# Patient Record
Sex: Female | Born: 1950 | Race: White | Hispanic: No | Marital: Married | Smoking: Former smoker
Health system: Southern US, Community
[De-identification: ages and names within clinical notes are randomized; demographics above are authoritative.]

## PROBLEM LIST (undated history)

## (undated) HISTORY — PX: TUBAL LIGATION: SHX77

## (undated) HISTORY — PX: DILATION AND CURETTAGE OF UTERUS: SHX78

## (undated) HISTORY — PX: TOTAL HIP ARTHROPLASTY: SHX124

---

## 2004-06-25 ENCOUNTER — Ambulatory Visit: Payer: Self-pay | Admitting: Podiatry

## 2004-07-04 ENCOUNTER — Ambulatory Visit: Payer: Self-pay | Admitting: Podiatry

## 2016-05-04 ENCOUNTER — Encounter: Payer: Self-pay | Admitting: *Deleted

## 2016-05-04 ENCOUNTER — Ambulatory Visit
Admission: EM | Admit: 2016-05-04 | Discharge: 2016-05-04 | Disposition: A | Discharge status: Home / Self Care | Payer: BC Managed Care – PPO | Attending: Family Medicine | Admitting: Family Medicine

## 2016-05-04 ENCOUNTER — Ambulatory Visit (INDEPENDENT_AMBULATORY_CARE_PROVIDER_SITE_OTHER): Payer: BC Managed Care – PPO

## 2016-05-04 DIAGNOSIS — S20212A Contusion of left front wall of thorax, initial encounter: Secondary | ICD-10-CM

## 2016-05-04 MED ORDER — OXYCODONE HCL 5 MG PO TABS: 5.0000 mg | ORAL_TABLET | Freq: Three times a day (TID) | ORAL | 0 refills | Status: DC | PRN

## 2016-05-04 MED ORDER — OXYCODONE-ACETAMINOPHEN 5-325 MG PO TABS: 1.0000 | ORAL_TABLET | Freq: Three times a day (TID) | ORAL | 0 refills | Status: DC | PRN

## 2016-05-04 NOTE — ED Provider Notes (Signed)
[facility] ____________________________________________  Time seen: Approximately 435 PM  I have reviewed the triage vital signs and the nursing notes.   HISTORY  Chief Complaint Chest Pain   HPI Robyn Robertson is a 65 y.o. female presenting today for the complaints of left rib and breast tenderness. Patient reports pain onset 1 week ago after a fall. Patient reports that she got up in the middle of the night to go to the bathroom, tripped and stumbled causing her to hit her left rib on her footboard of her bed. Denies fall to the ground. Denies head injury or loss of consciousness. Patient reports pain is fully reproducible by direct palpation. Patient states no pain at rest. Denies any radiation pain. Patient denies any other pain or injury. Patient reports she fell only because she tripped when getting up quickly to bathroom overnight. States left breast was originally sore from hitting the bed, but states now resolved.  Patient denies any chest pain, shortness breath, arm pain, paresthesias, weakness, neck discomfort, chest pain with deep breath, dizziness, weakness, abdominal pain, dysuria, neck or back pain. Denies fevers. Denies recent sickness, hospitalizations, surgeries or antibiotic use.  PCP: Luz BrazenHartman   History reviewed. No pertinent past medical history. Anxiety  Left lower pneumonia 2016  There are no active problems to display for this patient. Anxiety  Past Surgical History:  Procedure Laterality Date  . DILATION AND CURETTAGE OF UTERUS    . TUBAL LIGATION      Current Outpatient Rx  . Order #: [id number] Class: Historical Med  . Order #: [id number] Class: Historical Med  . Order #: [id number] Class: Print    No current facility-administered medications for this encounter.   Current Outpatient Prescriptions:  .  ALPRAZolam (XANAX) 1 MG tablet, Take 1 mg by mouth at bedtime as needed for anxiety., Disp: , Rfl:  .  PARoxetine (PAXIL) 10 MG tablet,  Take 10 mg by mouth daily., Disp: , Rfl:  .  oxyCODONE (ROXICODONE) 5 MG immediate release tablet, Take 1 tablet (5 mg total) by mouth every 8 (eight) hours as needed for moderate pain, severe pain or breakthrough pain (do not drive or operate machinery while taking as can cause drowsiness.)., Disp: 8 tablet, Rfl: 0  Allergies Sulfa antibiotics  History reviewed. No pertinent family history.  Social History Social History  Substance Use Topics  . Smoking status: Former Games developermoker  . Smokeless tobacco: Never Used  . Alcohol use Yes    Review of Systems Constitutional: No fever/chills Eyes: No visual changes. ENT: No sore throat. Cardiovascular: Denies chest pain. Respiratory: Denies shortness of breath. Gastrointestinal: No abdominal pain.  No nausea, no vomiting.  No diarrhea.  No constipation. Genitourinary: Negative for dysuria. Musculoskeletal: Negative for back pain. Skin: Negative for rash. Neurological: Negative for headaches, focal weakness or numbness.  10-point ROS otherwise negative.  ____________________________________________   PHYSICAL EXAM:  VITAL SIGNS: ED Triage Vitals  Enc Vitals Group     BP 05/04/16 1605 (!) 183/81     Pulse Rate 05/04/16 1605 86     Resp 05/04/16 1605 16     Temp 05/04/16 1605 97.8 F (36.6 C)     Temp Source 05/04/16 1605 Oral     SpO2 05/04/16 1605 100 %     Weight 05/04/16 1608 160 lb (72.6 kg)     Height 05/04/16 1608 5\' 6"  (1.676 m)     Head Circumference --      Peak Flow --  Pain Score 05/04/16 1743 0     Pain Loc --      Pain Edu? --      Excl. in GC? --     Constitutional: Alert and oriented. Well appearing and in no acute distress. Eyes: Conjunctivae are normal. PERRL. EOMI. ENT      Head: Normocephalic and atraumatic.      Mouth/Throat: Mucous membranes are moist. Cardiovascular: Normal rate, regular rhythm. Grossly normal heart sounds.  Good peripheral circulation. Respiratory: Normal respiratory effort  without tachypnea nor retractions. Breath sounds are clear and equal bilaterally. No wheezes/rales/rhonchi.. Gastrointestinal: Soft and nontender. No distention.  No CVA tenderness. Musculoskeletal:  Nontender with normal range of motion in all extremities. No midline cervical, thoracic or lumbar tenderness to palpation. Sensation equal to bilaterally upper and lower extremities. Bilateral hand grips strong and equal. Except: Left lateral rib at midaxillary line approximate along 6-8 ribs mild to moderate tenderness to direct palpation, no ecchymosis, no palpable deformity, no erythema or rash, left breast nontender. No pain with arm movements.  Neurologic:  Normal speech and language. No gross focal neurologic deficits are appreciated. Speech is normal. No gait instability.  Skin:  Skin is warm, dry and intact. No rash noted. Psychiatric: Mood and affect are normal. Speech and behavior are normal. Patient exhibits appropriate insight and judgment   ___________________________________________   LABS (all labs ordered are listed, but only abnormal results are displayed)  Labs Reviewed - No data to display ____________________________________________  RADIOLOGY  No results found. ____________________________________________   PROCEDURES Procedures   INITIAL IMPRESSION / ASSESSMENT AND PLAN / ED COURSE  Pertinent labs & imaging results that were available during my care of the patient were reviewed by me and considered in my medical decision making (see chart for details).  Very well-appearing patient. No acute distress. Presents for the complaints of left rib and breast tenderness after mechanical injury one week ago. Patient reports that she fell only because he tripped. States pain she feels is located only at site of direct impact from injury. Denies head injury or loss of consciousness. Denies any other pain or injuries. Patient reports pain is fully reproducible by direct palpation.  Denies any pain at rest. Denies pain radiation. Denies chest pain or shortness of breath. Concern for rib fracture versus rib contusion. Will evaluate by x-ray.  Left rib x-ray per radiologist no rib fracture seen, minimal left basilar linear atelectasis or scarring. History of left pneumonia. Encouraged supportive care. Avoidance of heavy lifting or strenuous activity. Encourage PCP follow up. Prn percocet. Counseled regarding deep breaths multiples times per day. Counseled regarding monitoring blood pressure at home and follow up. Discussed indication, risks and benefits of medications with patient.  Discussed follow up with Primary care physician this week. Discussed follow up and return parameters including no resolution or any worsening concerns. Patient verbalized understanding and agreed to plan.   ____________________________________________   FINAL CLINICAL IMPRESSION(S) / ED DIAGNOSES  Final diagnoses:  Rib contusion, left, initial encounter     Discharge Medication List as of 05/04/2016  5:26 PM    START taking these medications   Details  oxyCODONE-acetaminophen (ROXICET) 5-325 MG tablet Take 1 tablet by mouth every 8 (eight) hours as needed for moderate pain or severe pain (Do not drive or operate heavy machinery while taking as can cause drowsiness.)., Starting Mon 05/04/2016, Print        Note: This dictation was prepared with Dragon dictation along with smaller phrase technology. Any  transcriptional errors that result from this process are unintentional.    Clinical Course      Renford Dills, NP 05/14/16 2202    Renford Dills, NP 05/14/16 2205

## 2016-05-04 NOTE — Discharge Instructions (Signed)
Take medication as prescribed. Rest. Drink plenty of fluids. Take deep breaths multiples per day.  Follow up with your primary care physician this week. Return to Urgent care or ER for fever, chest pain, shortness of breath, for new or worsening concerns.

## 2016-05-04 NOTE — ED Triage Notes (Signed)
One week ago pt got up to BR during night and tripped in bedroom and fell striking left breast on bedpost. C/o left sided chest and breast pain which has persisted throughout week.

## 2018-04-06 IMAGING — CR DG RIBS W/ CHEST 3+V*L*
5 series · 5 of 5 positions shown · non-contrast
Comparison: None.

CLINICAL DATA: Left rib pain after slipping getting out of bed in
the dark and hitting her left chest on a bed post 1 week ago.

EXAM:
LEFT RIBS AND CHEST - 3+ VIEW

[chest pa]
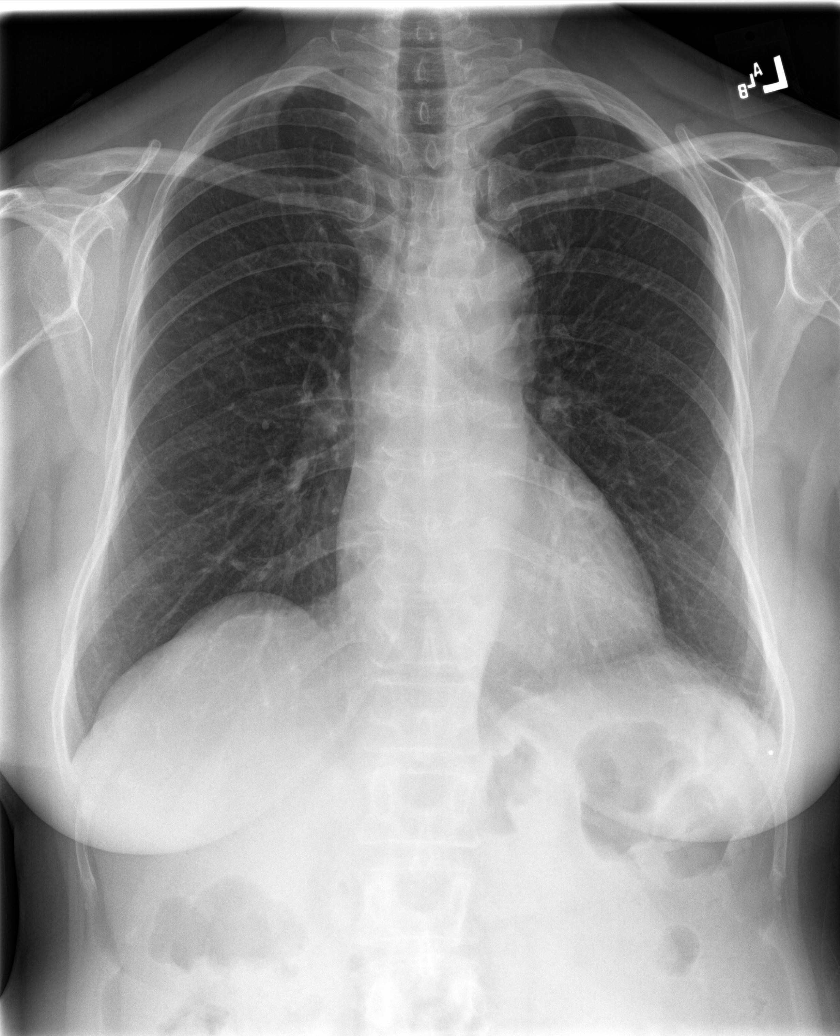

[rib pa (1 of 2)]
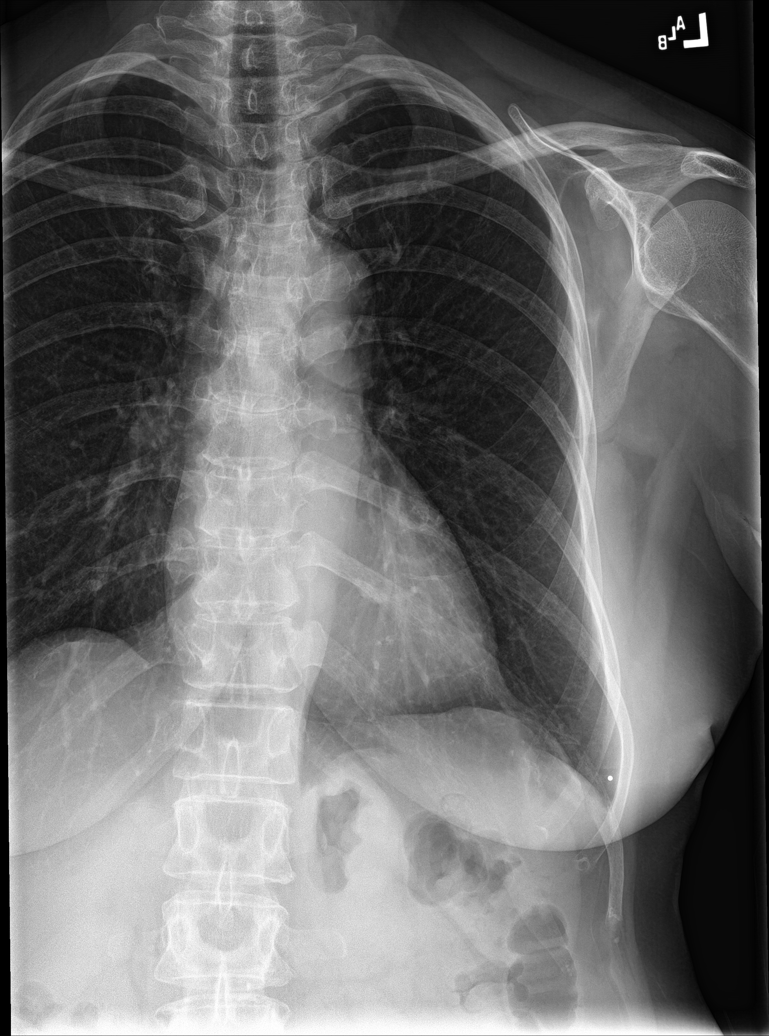

[rib pa (2 of 2)]
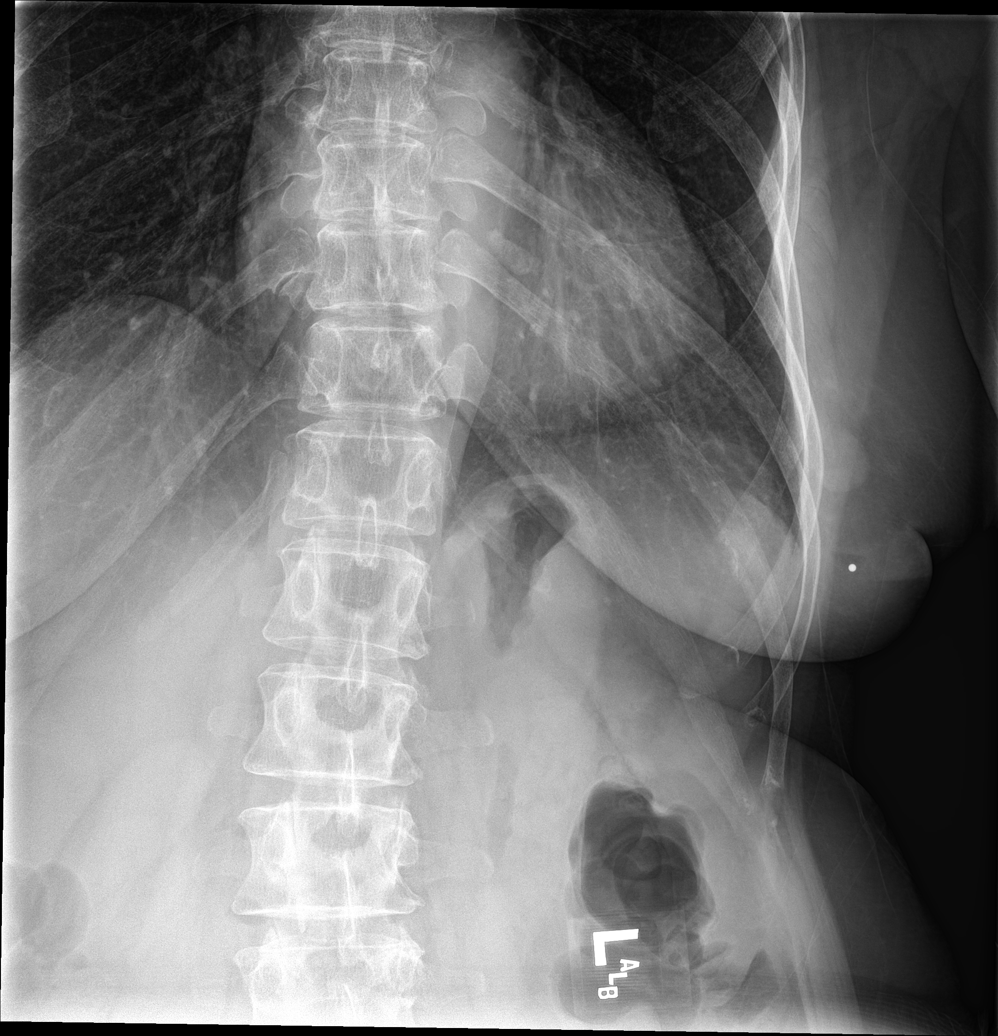

[rib obl (1 of 2)]
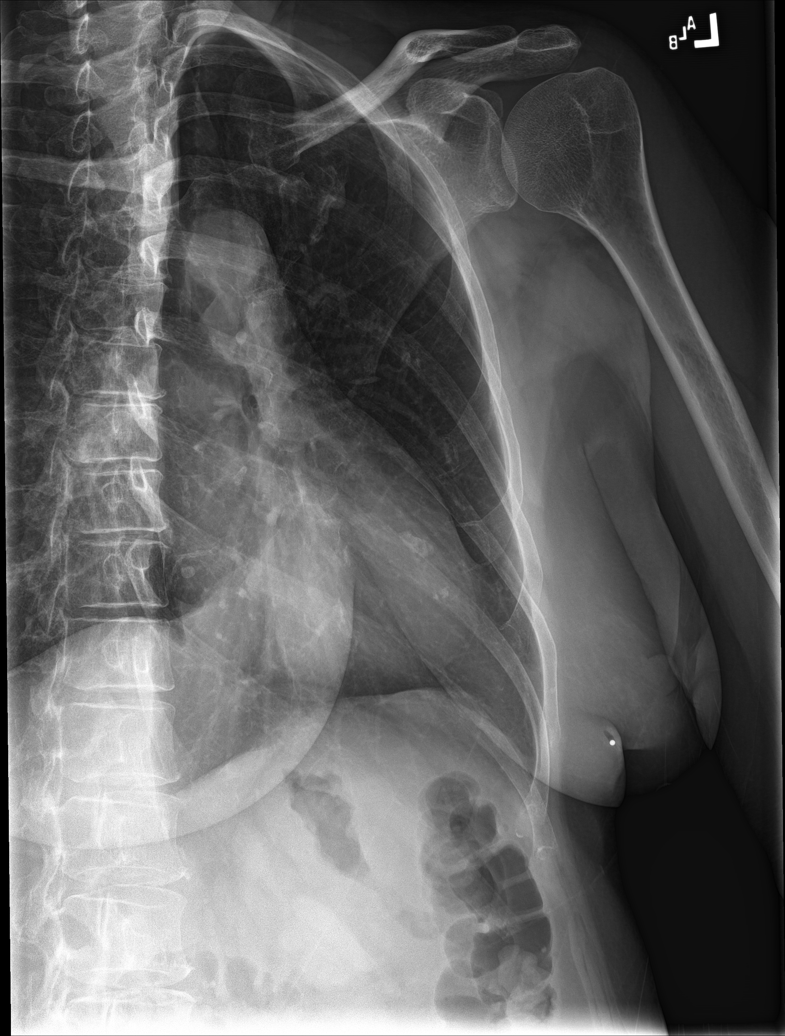

[rib obl (2 of 2)]
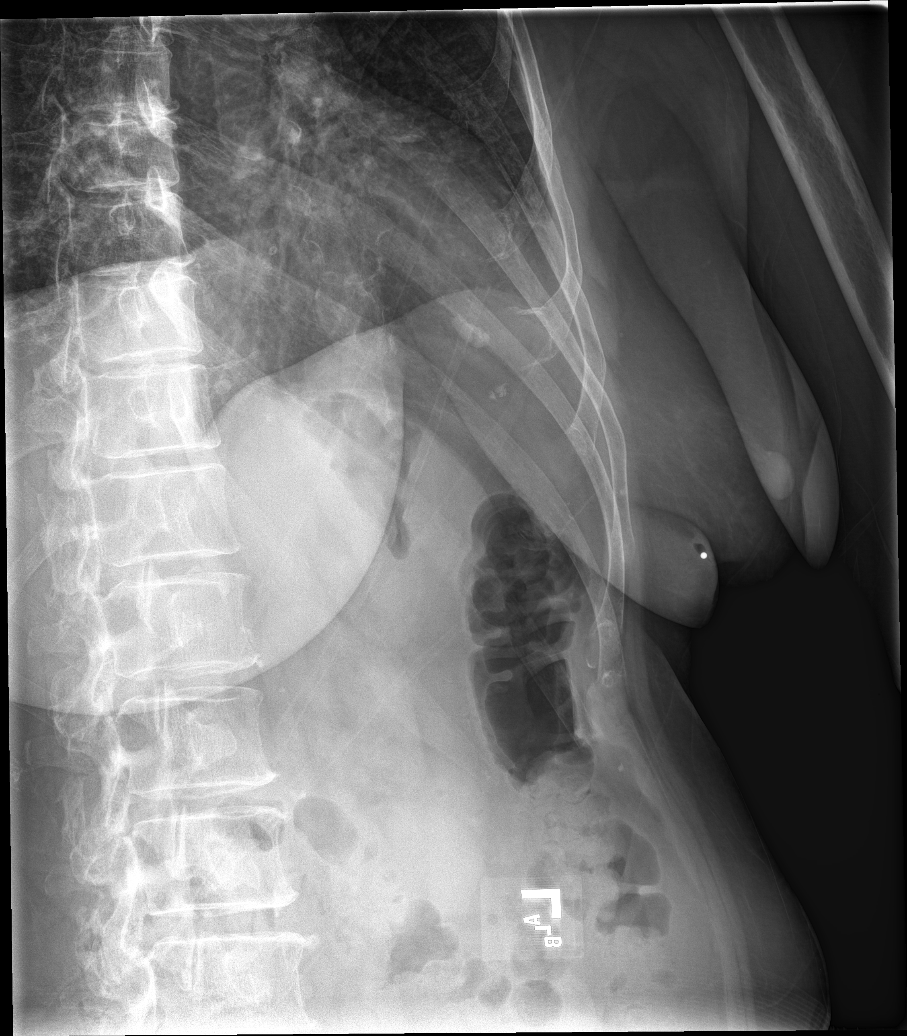

[5 of 5 positions shown; findings below may reference images not displayed]

FINDINGS: Normal sized heart. Minimal linear density at the left lung base. No
rib fracture or pneumothorax seen.
IMPRESSION: 1. No rib fracture seen.
2. Minimal left basilar linear atelectasis or scarring.

## 2022-05-14 ENCOUNTER — Ambulatory Visit
Admission: EM | Admit: 2022-05-14 | Discharge: 2022-05-14 | Disposition: A | Discharge status: Home / Self Care | Payer: Medicare HMO | Attending: Emergency Medicine | Admitting: Emergency Medicine

## 2022-05-14 ENCOUNTER — Ambulatory Visit (INDEPENDENT_AMBULATORY_CARE_PROVIDER_SITE_OTHER): Payer: Medicare HMO

## 2022-05-14 ENCOUNTER — Encounter: Payer: Self-pay | Admitting: Emergency Medicine

## 2022-05-14 DIAGNOSIS — K30 Functional dyspepsia: Secondary | ICD-10-CM | POA: Diagnosis present

## 2022-05-14 DIAGNOSIS — R0789 Other chest pain: Secondary | ICD-10-CM

## 2022-05-14 DIAGNOSIS — S20219A Contusion of unspecified front wall of thorax, initial encounter: Secondary | ICD-10-CM | POA: Diagnosis not present

## 2022-05-14 LAB — TROPONIN I (HIGH SENSITIVITY): Troponin I (High Sensitivity): <2 ng/L (ref <18)

## 2022-05-14 MED ORDER — FAMOTIDINE 20 MG PO TABS: 20.0000 mg | ORAL_TABLET | Freq: Two times a day (BID) | ORAL | 0 refills | Status: AC

## 2022-05-14 MED ORDER — PANTOPRAZOLE SODIUM 20 MG PO TBEC: 20.0000 mg | DELAYED_RELEASE_TABLET | Freq: Every day | ORAL | 0 refills | Status: AC

## 2022-05-14 NOTE — Discharge Instructions (Addendum)
Your x-ray was normal.  There did not appear to be any fracture of your sternum on x-ray.  Your troponin, EKG were  also negative.  There does not appear to be an issue with your heart right now.  Try the Pepcid, and if that does not work, then add the [employer].  Follow-up with your primary care provider if you are not better after being on the Pepcid and Protonix for 3 days, go to the ER if it gets worse, or for any concerns.

## 2022-05-14 NOTE — ED Triage Notes (Signed)
Pt was sleeping on her couch and fell off of it 1 week ago. She hit her head on the top of her coffee table she had a black right eye and a busted lip. She hit her chest on the side of the coffee table. Since then she has had a pressure feeling in her chest and indigestion.

## 2022-05-14 NOTE — ED Provider Notes (Signed)
HPI  SUBJECTIVE:  Robyn Robertson is a 71 y.o. female who presents with constant indigestion with belching, waterbrash, substernal pressure with deep inspiration and exertion after falling off the couch, hitting her lower sternum on the edge of a coffee table.  She reports mild dyspnea on exertion and states that the back of her shoulders feel sore.  No chest pain, nausea, diaphoresis, positional component.  It does not radiate up her neck, down her arm, or through to her back.  No coughing, wheezing, shortness of breath, pleuritic chest pain.  No fevers, palpitations, generalized weakness, syncope, abdominal pain.  She has never had indigestion before.  She has tried Mylanta, Pepto-Bismol.  Symptoms are better with lying down, worse with exertion and taking a deep breath in.  She was having symptoms earlier this morning, but is currently asymptomatic here in the department.  She has a past medical history of borderline elevated triglycerides, and anxiety.  No history of diabetes, hypertension, coronary disease, MI, PAD/PVD, CVA, smoking.  Family history negative for early MI before 47.  Father had MI at age 29.  PCP: [facility] internal medicine.    History reviewed. No pertinent past medical history.  Past Surgical History:  Procedure Laterality Date   DILATION AND CURETTAGE OF UTERUS     TOTAL HIP ARTHROPLASTY Right    TUBAL LIGATION      History reviewed. No pertinent family history.  Social History   Tobacco Use   Smoking status: Former   Smokeless tobacco: Never  Building services engineer Use: Former  Substance Use Topics   Alcohol use: Yes   Drug use: Never    No current facility-administered medications for this encounter.  Current Outpatient Medications:    ALPRAZolam (XANAX) 1 MG tablet, Take 1 mg by mouth at bedtime as needed for anxiety., Disp: , Rfl:    famotidine (PEPCID) 20 MG tablet, Take 1 tablet (20 mg total) by mouth 2 (two) times daily., Disp: 40 tablet, Rfl:  0   pantoprazole (PROTONIX) 20 MG tablet, Take 1 tablet (20 mg total) by mouth daily., Disp: 30 tablet, Rfl: 0   PARoxetine (PAXIL) 10 MG tablet, Take 10 mg by mouth daily., Disp: , Rfl:    meloxicam (MOBIC) 15 MG tablet, Take 15 mg by mouth daily., Disp: , Rfl:    Multiple Vitamin (DAILY VITAMINS) tablet, Take 1 tablet by mouth daily., Disp: , Rfl:   Allergies  Allergen Reactions   Ciprofloxacin Hives   Oxycodone Hives   Sulfa Antibiotics Hives     ROS  As noted in HPI.   Physical Exam  BP (!) 154/78 (BP Location: Left Arm)   Pulse 84   Temp 98.5 F (36.9 C) (Oral)   Resp 16   SpO2 96%   Constitutional: Well developed, well nourished, no acute distress Eyes:  EOMI, conjunctiva normal bilaterally HENT: Normocephalic, atraumatic,mucus membranes moist Respiratory: Normal inspiratory effort, lungs clear bilaterally.  Normal appearance of the sternum.  Positive lower sternal tenderness.  No crepitus, bruising, swelling. Cardiovascular: Normal rate, regular rhythm, no murmurs rubs or gallops RP, PT 2+ and equal bilaterally. GI: nondistended skin: No rash, skin intact Musculoskeletal: no deformities Neurologic: Alert & oriented x 3, no focal neuro deficits Psychiatric: Speech and behavior appropriate   ED Course   Medications - No data to display  Orders Placed This Encounter  Procedures   DG Chest 2 View    Standing Status:   Standing    Number  of Occurrences:   1    Order Specific Question:   Reason for Exam (SYMPTOM  OR DIAGNOSIS REQUIRED)    Answer:   Direct trauma, tenderness lower sternum 1 week ago.  Rule out any acute changes.   ED EKG    Standing Status:   Standing    Number of Occurrences:   1    Order Specific Question:   Reason for Exam    Answer:   Chest Pain    Results for orders placed or performed during the hospital encounter of 05/14/22 (from the past 24 hour(s))  Troponin I (High Sensitivity)     Status: None   Collection Time: 05/14/22 10:49  AM  Result Value Ref Range   Troponin I (High Sensitivity) <2 <18 ng/L   DG Chest 2 View  Result Date: 05/14/2022 CLINICAL DATA:  Direct trauma, tenderness lower sternum 1 week ago. Rule out any acute changes. EXAM: CHEST - 2 VIEW COMPARISON:  05/04/2016 FINDINGS: Heart and mediastinal contours are within normal limits. No focal opacities or effusions. No acute bony abnormality. No visible abnormality of the sternum. IMPRESSION: No active cardiopulmonary disease. Electronically Signed   By: Charlett Nose M.D.   On: 05/14/2022 10:55    ED Clinical Impression  1. Contusion of sternum, initial encounter   2. Indigestion      ED Assessment/Plan  Patient has some tenderness along the lower sternum, but no bruising, crepitus, swelling.  She is reporting chest pressure that is worse with exertion, some dyspnea on exertion, indigestion.  Will check EKG, chest x-ray, troponin since she has had symptoms for a week.  She was having symptoms earlier this morning.  EKG, normal sinus rhythm, rate 81.  Normal axis, normal intervals.  No hypertrophy.  No ST-T wave changes.  No previous EKG for comparison.  Patient asymptomatic while EKG was obtained.  Reviewed imaging independently. No acute cardiopulmonary disease.  No visible abnormality of the sternum.  See radiology report for full details.  Troponin negative.  Chest x-ray is normal.  There does not appear to be any sternal fracture.  Patient with a contusion of the sternum.  She is prescribed Mobic.  I am unsure as to why she is having indigestion, but it does not appear to be ACS.  Will send home with some Pepcid, Protonix.   follow-up with PCP as needed.  ER return precautions given.      Discussed labs, imaging, MDM, treatment plan, and plan for follow-up with patient. Discussed sn/sx that should prompt return to the ED. patient agrees with plan.   Meds ordered this encounter  Medications   famotidine (PEPCID) 20 MG tablet    Sig: Take 1 tablet  (20 mg total) by mouth 2 (two) times daily.    Dispense:  40 tablet    Refill:  0   pantoprazole (PROTONIX) 20 MG tablet    Sig: Take 1 tablet (20 mg total) by mouth daily.    Dispense:  30 tablet    Refill:  0      *This clinic note was created using Scientist, clinical (histocompatibility and immunogenetics). Therefore, there may be occasional mistakes despite careful proofreading.  ?    Domenick Gong, MD 05/14/22 1125
